# Patient Record
Sex: Female | Born: 1985 | Race: Black or African American | Hispanic: No | Marital: Single | Smoking: Never smoker
Health system: Southern US, Community
[De-identification: ages and names within clinical notes are randomized; demographics above are authoritative.]

---

## 2020-09-22 ENCOUNTER — Encounter (HOSPITAL_COMMUNITY): Payer: Self-pay

## 2020-09-22 ENCOUNTER — Emergency Department (HOSPITAL_COMMUNITY): Payer: BC Managed Care – PPO

## 2020-09-22 ENCOUNTER — Emergency Department (HOSPITAL_COMMUNITY)
Admission: EM | Admit: 2020-09-22 | Discharge: 2020-09-22 | Disposition: A | Discharge status: Home / Self Care | Payer: BC Managed Care – PPO | Attending: Emergency Medicine | Admitting: Emergency Medicine

## 2020-09-22 ENCOUNTER — Other Ambulatory Visit: Payer: Self-pay

## 2020-09-22 DIAGNOSIS — N939 Abnormal uterine and vaginal bleeding, unspecified: Secondary | ICD-10-CM | POA: Diagnosis present

## 2020-09-22 DIAGNOSIS — R5383 Other fatigue: Secondary | ICD-10-CM | POA: Diagnosis not present

## 2020-09-22 DIAGNOSIS — M545 Low back pain, unspecified: Secondary | ICD-10-CM | POA: Diagnosis not present

## 2020-09-22 DIAGNOSIS — N938 Other specified abnormal uterine and vaginal bleeding: Secondary | ICD-10-CM | POA: Insufficient documentation

## 2020-09-22 DIAGNOSIS — R102 Pelvic and perineal pain: Secondary | ICD-10-CM

## 2020-09-22 DIAGNOSIS — R112 Nausea with vomiting, unspecified: Secondary | ICD-10-CM | POA: Insufficient documentation

## 2020-09-22 DIAGNOSIS — I10 Essential (primary) hypertension: Secondary | ICD-10-CM | POA: Diagnosis not present

## 2020-09-22 LAB — CBC WITH DIFFERENTIAL/PLATELET
Abs Immature Granulocytes: 0.02 10*3/uL (ref 0.00–0.07)
Basophils Absolute: 0.1 10*3/uL (ref 0.0–0.1)
Basophils Relative: 1 %
Eosinophils Absolute: 0.3 10*3/uL (ref 0.0–0.5)
Eosinophils Relative: 3 %
HCT: 40.4 % (ref 36.0–46.0)
Hemoglobin: 12.6 g/dL (ref 12.0–15.0)
Immature Granulocytes: 0 %
Lymphocytes Relative: 25 %
Lymphs Abs: 2.1 10*3/uL (ref 0.7–4.0)
MCH: 28.4 pg (ref 26.0–34.0)
MCHC: 31.2 g/dL (ref 30.0–36.0)
MCV: 91.2 fL (ref 80.0–100.0)
Monocytes Absolute: 0.5 10*3/uL (ref 0.1–1.0)
Monocytes Relative: 6 %
Neutro Abs: 5.5 10*3/uL (ref 1.7–7.7)
Neutrophils Relative %: 65 %
Platelets: 282 10*3/uL (ref 150–400)
RBC: 4.43 MIL/uL (ref 3.87–5.11)
RDW: 14.6 % (ref 11.5–15.5)
WBC: 8.5 10*3/uL (ref 4.0–10.5)
nRBC: 0 % (ref 0.0–0.2)

## 2020-09-22 LAB — BASIC METABOLIC PANEL
Anion gap: 9 (ref 5–15)
BUN: 7 mg/dL (ref 6–20)
CO2: 24 mmol/L (ref 22–32)
Calcium: 8.8 mg/dL — ABNORMAL LOW (ref 8.9–10.3)
Chloride: 106 mmol/L (ref 98–111)
Creatinine, Ser: 1.08 mg/dL — ABNORMAL HIGH (ref 0.44–1.00)
GFR, Estimated: >60 mL/min (ref >60)
Glucose, Bld: 100 mg/dL — ABNORMAL HIGH (ref 70–99)
Potassium: 3.8 mmol/L (ref 3.5–5.1)
Sodium: 139 mmol/L (ref 135–145)

## 2020-09-22 LAB — I-STAT BETA HCG BLOOD, ED (MC, WL, AP ONLY): I-stat hCG, quantitative: <5 m[IU]/mL (ref <5)

## 2020-09-22 MED ORDER — MEGESTROL ACETATE 40 MG PO TABS: 40.0000 mg | ORAL_TABLET | Freq: Every day | ORAL | 0 refills | Status: AC

## 2020-09-22 NOTE — Discharge Instructions (Addendum)
You were seen in the emergency department for abnormal heavy vaginal bleeding for 2 weeks.  Your pregnancy test was negative.  Your red blood cell count was stable.  You had an ultrasound that showed some leiomyomas.  This will need follow-up with your gynecologist.  We are starting you on some hormone therapy to help slow your bleeding.  Your blood pressure was also very much elevated.  Please follow this up with your primary care doctor.

## 2020-09-22 NOTE — ED Provider Notes (Signed)
[facility]  HOSPITAL-EMERGENCY DEPT Provider Note   CSN: [id number] Arrival date & time: 09/22/20  7673     History Chief Complaint  Patient presents with  . Vaginal Bleeding    Michele Rose is a 35 y.o. female.  She has no significant past medical history.  She said last month she had an abnormal period that lasted only 1 day.  This month she has had vaginal bleeding since the 11th.  For the past few days its been heavier associated with clot and back and left lower quadrant abdominal pain.  Feeling nauseous and had some vomiting today.  Has had 3 - home pregnancy test.  Concern for miscarriage or fibroids.  The history is provided by the patient.  Vaginal Bleeding Quality:  Dark red, heavier than menses and clots Severity:  Moderate Onset quality:  Gradual Duration:  12 days Timing:  Intermittent Progression:  Worsening Chronicity:  New Menstrual history:  Irregular Possible pregnancy: yes   Context: spontaneously   Relieved by:  Nothing Worsened by:  Nothing Ineffective treatments:  None tried Associated symptoms: abdominal pain, back pain, fatigue and nausea   Associated symptoms: no dysuria, no fever and no vaginal discharge   Risk factors: no bleeding disorder        History reviewed. No pertinent past medical history.  There are no problems to display for this patient.   History reviewed. No pertinent surgical history.   OB History   No obstetric history on file.     No family history on file.  Social History   Tobacco Use  . Smoking status: Never Smoker  . Smokeless tobacco: Never Used    Home Medications Prior to Admission medications   Not on File    Allergies    Patient has no known allergies.  Review of Systems   Review of Systems  Constitutional: Positive for fatigue. Negative for fever.  HENT: Negative for sore throat.   Eyes: Negative for visual disturbance.  Respiratory: Negative for shortness of breath.    Cardiovascular: Negative for chest pain.  Gastrointestinal: Positive for abdominal pain and nausea.  Genitourinary: Positive for vaginal bleeding. Negative for dysuria and vaginal discharge.  Musculoskeletal: Positive for back pain.  Skin: Negative for rash.  Neurological: Negative for headaches.    Physical Exam Updated Vital Signs BP (!) 166/116 (BP Location: Left Arm)   Pulse 82   Temp 98.3 F (36.8 C) (Oral)   Resp 19   Ht 5\' 11"  (1.803 m)   Wt 127 kg   SpO2 100%   BMI 39.05 kg/m   Physical Exam Vitals and nursing note reviewed.  Constitutional:      General: She is not in acute distress.    Appearance: Normal appearance. She is well-developed. She is obese.  HENT:     Head: Normocephalic and atraumatic.  Eyes:     Conjunctiva/sclera: Conjunctivae normal.  Cardiovascular:     Rate and Rhythm: Normal rate and regular rhythm.     Heart sounds: No murmur heard.   Pulmonary:     Effort: Pulmonary effort is normal. No respiratory distress.     Breath sounds: Normal breath sounds.  Abdominal:     Palpations: Abdomen is soft.     Tenderness: There is no abdominal tenderness.  Musculoskeletal:        General: No deformity or signs of injury. Normal range of motion.     Cervical back: Neck supple.  Skin:    General: Skin is  warm and dry.     Capillary Refill: Capillary refill takes less than 2 seconds.  Neurological:     General: No focal deficit present.     Mental Status: She is alert.     ED Results / Procedures / Treatments   Labs (all labs ordered are listed, but only abnormal results are displayed) Labs Reviewed  BASIC METABOLIC PANEL - Abnormal; Notable for the following components:      Result Value   Glucose, Bld 100 (*)    Creatinine, Ser 1.08 (*)    Calcium 8.8 (*)    All other components within normal limits  CBC WITH DIFFERENTIAL/PLATELET  I-STAT BETA HCG BLOOD, ED ([facility], [facility], [facility] ONLY)    EKG None  Radiology US PELVIC COMPLETE W  TRANSVAGINAL AND TORSION R/O  Result Date: 09/22/2020 CLINICAL DATA:  Pelvic pain EXAM: TRANSABDOMINAL AND TRANSVAGINAL ULTRASOUND OF PELVIS DOPPLER ULTRASOUND OF OVARIES TECHNIQUE: Study was performed transabdominally to optimize pelvic field of view evaluation and transvaginally to optimize internal visceral architecture evaluation. Color and duplex Doppler ultrasound was utilized to evaluate blood flow to the ovaries. COMPARISON:  None. FINDINGS: Uterus Measurements: 7.6 x 4.7 x 5.0 cm = volume: 92.5 mL. There is a mildly hyperechoic mass in the superior fundal region measuring 2.4 x 2.4 x 3.0 cm. A nearby hypoechoic mass in the superior fundal region measures 1.3 x 1.0 x 1.7 cm. A hypoechoic mass in the rightward aspect of the uterus measures 2.4 x 2.4 x 2.3 cm. Several smaller apparent leiomyomas present. The overall echotexture of the myometrium is inhomogeneous. Endometrium Thickness: 8 mm.  No focal abnormality visualized. Right ovary Measurements: 2.8 x 3.8 x 3.0 cm = volume: 16.8 mL. There is a 2.7 x 2.6 x 2.2 cm apparent cyst right ovary. No other right-sided pelvic mass. Left ovary Measurements: 5.0 x 3.8 x 4.7 = volume: 47.4 mL. There are several presumed dominant follicles on the left, largest measuring approximately 2 x 1.5 cm. The largest apparent follicle contains mild debris. No noncystic masses evident. Pulsed Doppler evaluation of both ovaries demonstrates normal low-resistance arterial and venous waveforms. Other findings Localized free fluid adjacent to left ovary. IMPRESSION: 1. 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 Suspect leiomyomatous change throughout myometrium. 2.  No endometrial thickening. 3. Apparent follicles in each ovary. No noncystic adnexal lesions. Mild debris within a follicle on the left with nearby free fluid. Suspect recent cyst/follicle leakage on the left. 4. Low resistance waveform in each ovary. There are no findings that are felt to  be indicative of ovarian torsion. Electronically Signed   By: Bretta Bang III M.D.   On: 09/22/2020 09:17    Procedures Procedures   Medications Ordered in ED Medications - No data to display  ED Course  I have reviewed the triage vital signs and the nursing notes.  Pertinent labs & imaging results that were available during my care of the patient were reviewed by me and considered in my medical decision making (see chart for details).    MDM Rules/Calculators/A&P                         This patient complains of vaginal bleeding; this involves an extensive number of treatment Options and is a complaint that carries with it a high risk of complications and Morbidity. The differential includes miscarriage, ectopic pregnancy, dysfunctional uterine bleeding, anemia, fibroids  I ordered, reviewed and interpreted labs, which included CBC with  normal white count normal hemoglobin, chemistries normal other than mildly elevated creatinine, pregnancy test negative I ordered imaging studies which included pelvic ultrasound and I independently    visualized and interpreted imaging which showed no evidence of torsion, does have some small fibroids Previous records obtained and reviewed in epic, no recent admissions  After the interventions stated above, I reevaluated the patient and found patient to be hypertensive but otherwise asymptomatic.  She already has GYN appointment next week.  Will cover with Megace to try to slow down her bleeding.  Return instructions discussed    Final Clinical Impression(s) / ED Diagnoses Final diagnoses:  Dysfunctional uterine bleeding  Primary hypertension    Rx / DC Orders ED Discharge Orders         Ordered    megestrol (MEGACE) 40 MG tablet  Daily        09/22/20 1003           Terrilee Files, MD 09/22/20 1754

## 2020-09-22 NOTE — ED Triage Notes (Signed)
Pt sts vaginal bleeding since 3/11. Reports waking up this morning with increased amount of blood saturating the bed. 3 negative home pregnancy tests.

## 2021-12-22 IMAGING — US US PELVIS COMPLETE TRANSABD/TRANSVAG W DUPLEX
1 series · 13 of 25 positions shown · non-contrast
Comparison: None.

CLINICAL DATA: Pelvic pain

EXAM:
TRANSABDOMINAL AND TRANSVAGINAL ULTRASOUND OF PELVIS
DOPPLER ULTRASOUND OF OVARIES
TECHNIQUE: Study was performed transabdominally to optimize pelvic field of
view evaluation and transvaginally to optimize internal visceral
architecture evaluation.
Color and duplex Doppler ultrasound was utilized to evaluate blood
flow to the ovaries.

[Series 1: us pelvis complete transabd/transvag w duplex · 13 of 110 slices shown]
[im 1/110]
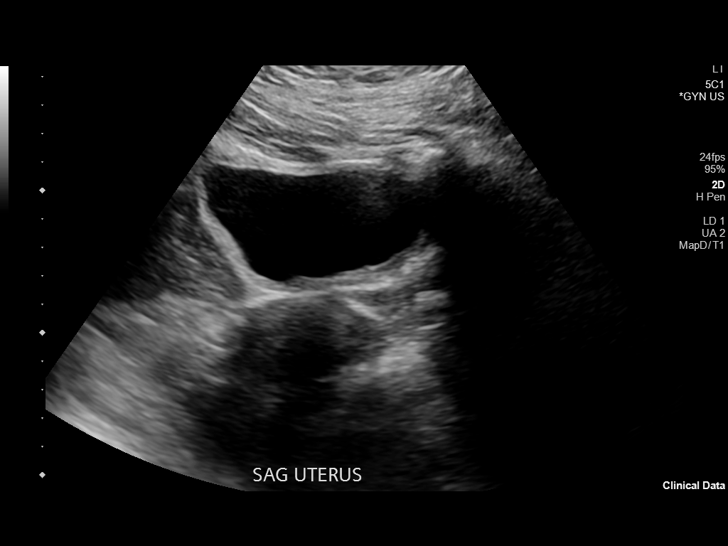
[im 10/110]
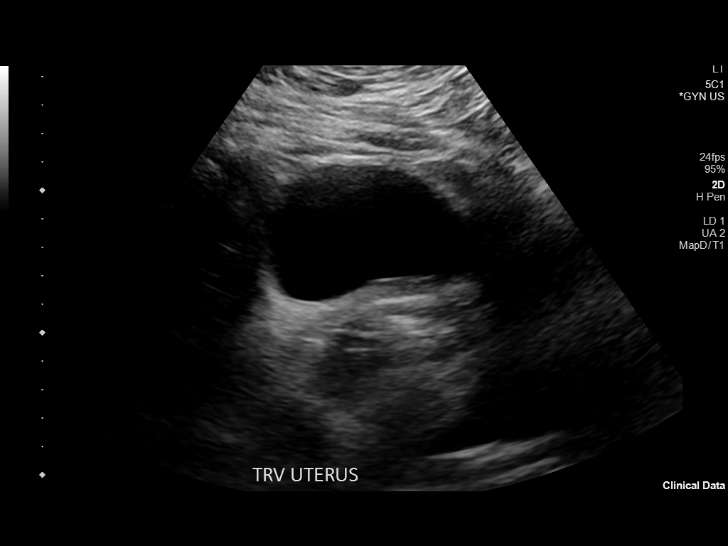
[im 19/110]
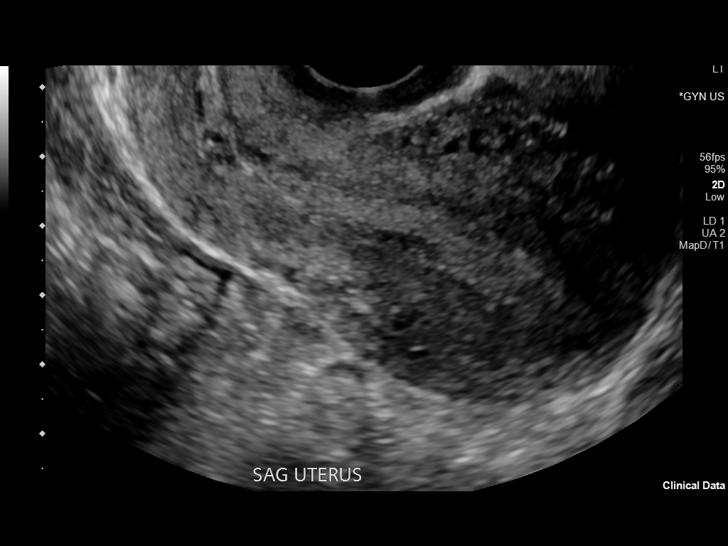
[im 28/110]
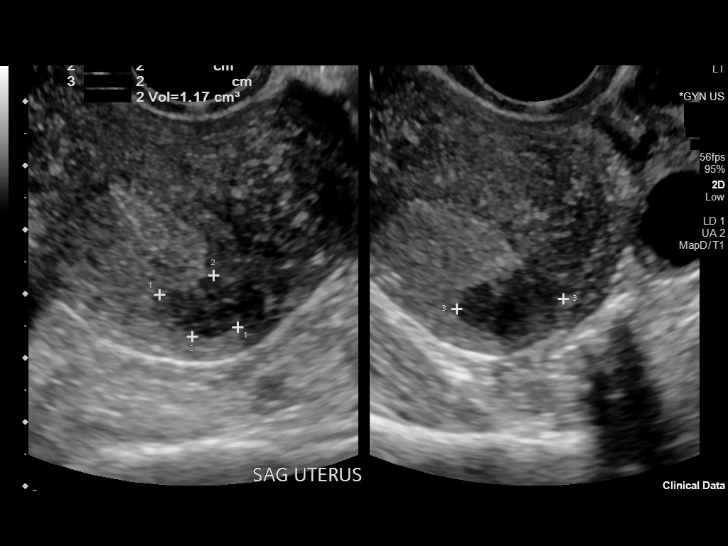
[im 37/110]
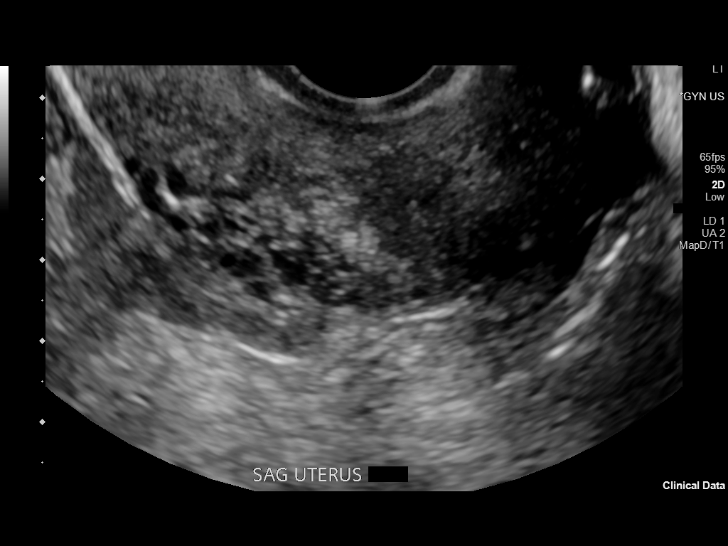
[im 46/110]
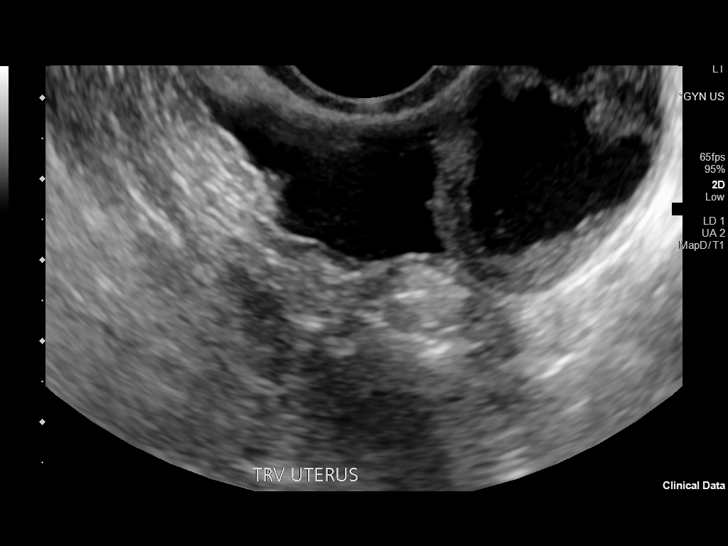
[im 55/110]
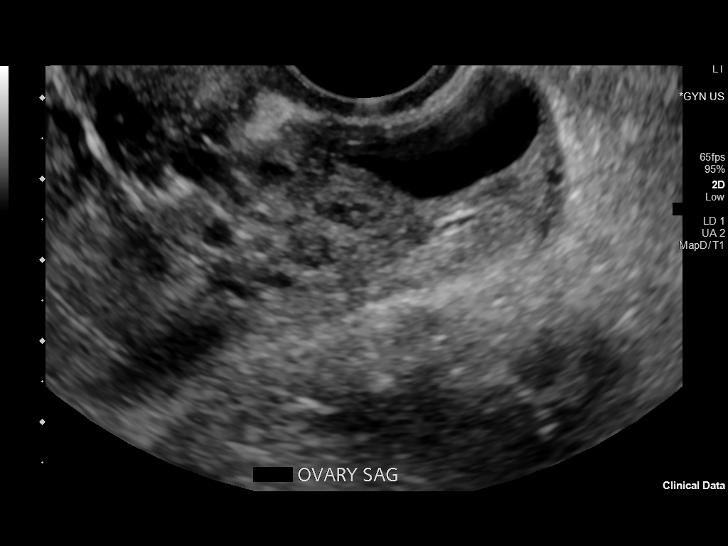
[im 64/110]
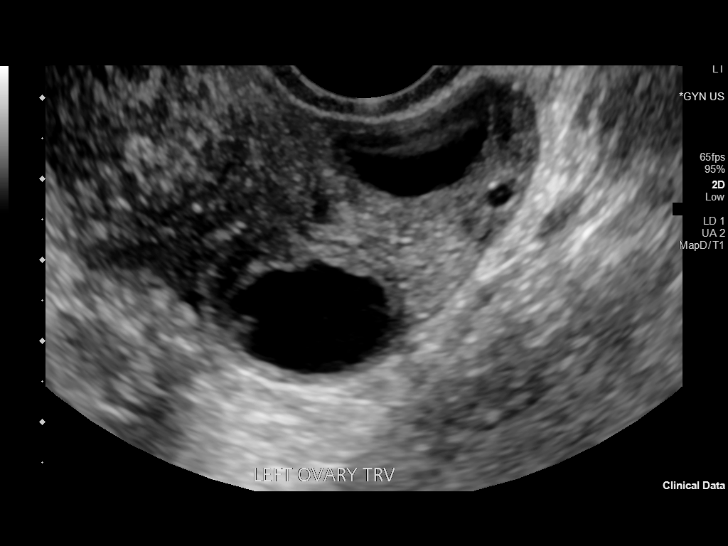
[im 73/110]
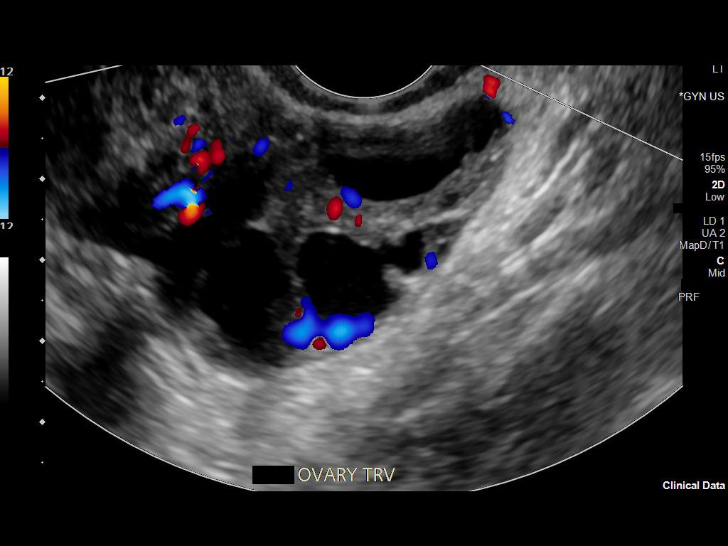
[im 82/110]
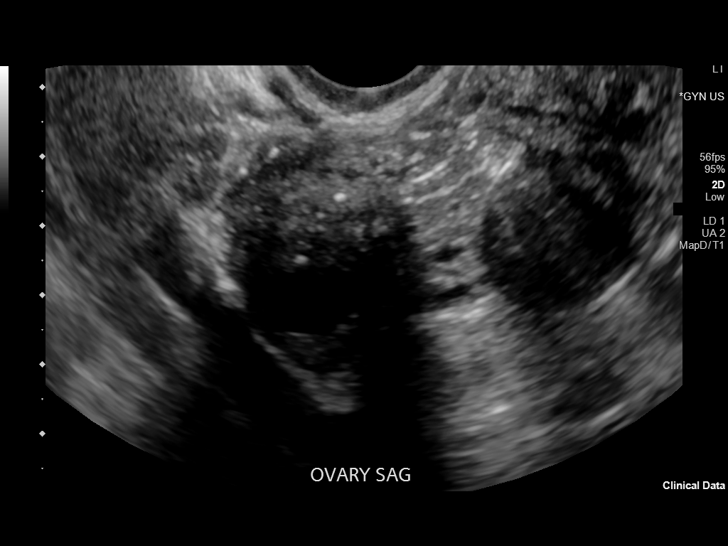
[im 91/110]
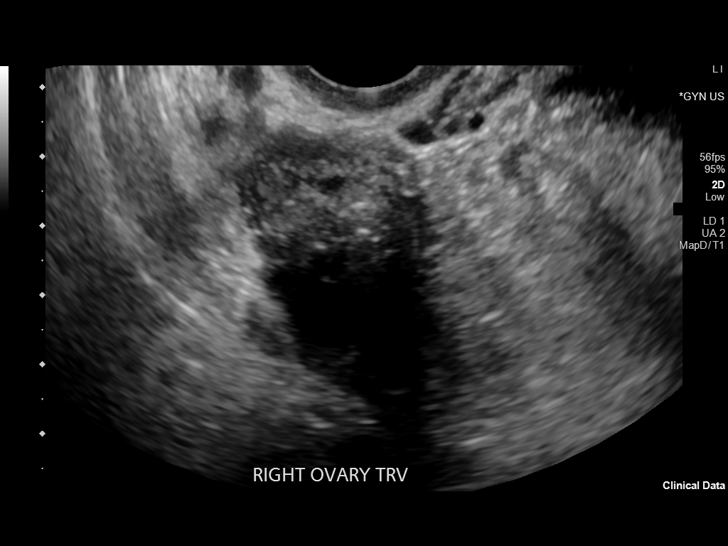
[im 100/110]
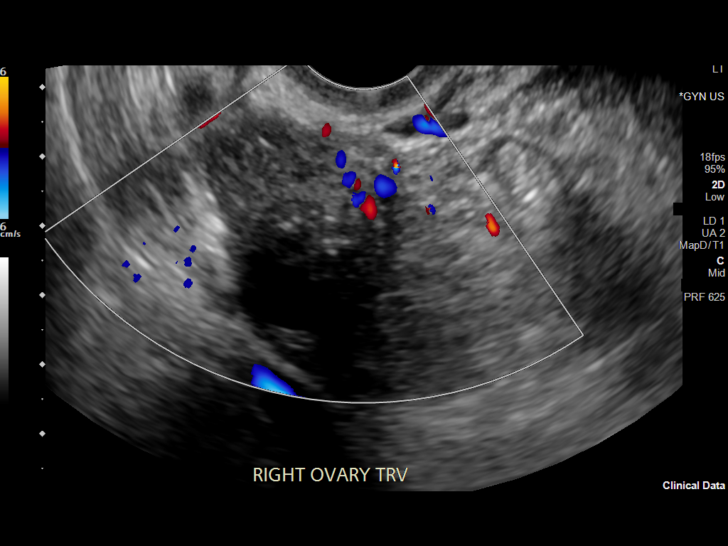
[im 110/110]
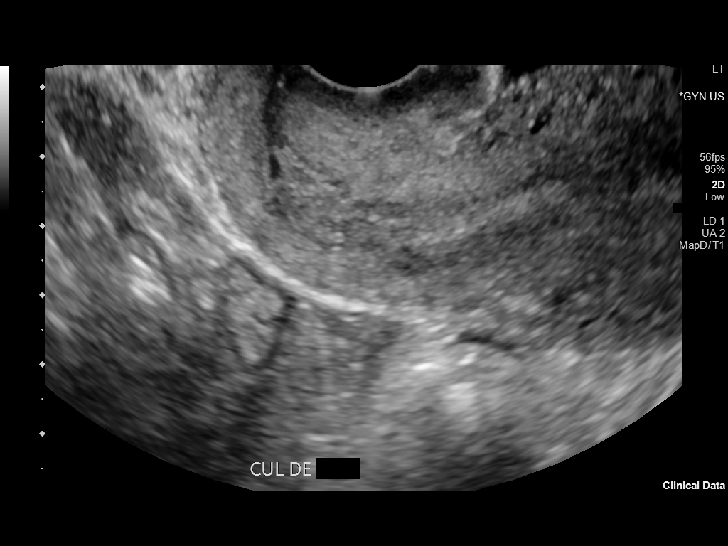

[13 of 25 positions shown; findings below may reference images not displayed]

FINDINGS: Uterus

Measurements: 7.6 x 4.7 x 5.0 cm = volume: 92.5 mL. There is a
mildly hyperechoic mass in the superior fundal region measuring
x 2.4 x 3.0 cm. A nearby hypoechoic mass in the superior fundal
region measures 1.3 x 1.0 x 1.7 cm. A hypoechoic mass in the
rightward aspect of the uterus measures 2.4 x 2.4 x 2.3 cm. Several
smaller apparent leiomyomas present. The overall echotexture of the
myometrium is inhomogeneous.

Endometrium

Thickness: 8 mm.  No focal abnormality visualized.

Right ovary

Measurements: 2.8 x 3.8 x 3.0 cm = volume: 16.8 mL. There is a 2.7 x
2.6 x 2.2 cm apparent cyst right ovary. No other right-sided pelvic
mass.

Left ovary

Measurements: 5.0 x 3.8 x 4.7 = volume: 47.4 mL. There are several
presumed dominant follicles on the left, largest measuring
approximately 2 x 1.5 cm. The largest apparent follicle contains
mild debris. No noncystic masses evident.

Pulsed Doppler evaluation of both ovaries demonstrates normal
low-resistance arterial and venous waveforms.

Other findings

Localized free fluid adjacent to left ovary.
IMPRESSION: 1. Leiomyomatous uterus with multiple fairly small well-defined
leiomyomas. Suspect leiomyomatous change throughout myometrium.

2.  No endometrial thickening.

3. Apparent follicles in each ovary. No noncystic adnexal lesions.
Mild debris within a follicle on the left with nearby free fluid.
Suspect recent cyst/follicle leakage on the left.

4. Low resistance waveform in each ovary. There are no findings that
are felt to be indicative of ovarian torsion.
